# Patient Record
Sex: Female | Born: 1975 | Race: Black or African American | Hispanic: No | Marital: Married | Smoking: Never smoker
Health system: Southern US, Community
[De-identification: ages and names within clinical notes are randomized; demographics above are authoritative.]

## PROBLEM LIST (undated history)

## (undated) HISTORY — PX: NO PAST SURGERIES: SHX2092

---

## 1998-01-17 ENCOUNTER — Ambulatory Visit (HOSPITAL_COMMUNITY): Admission: AD | Admit: 1998-01-17 | Discharge: 1998-01-17 | Payer: Self-pay | Admitting: Obstetrics and Gynecology

## 1998-11-09 ENCOUNTER — Emergency Department (HOSPITAL_COMMUNITY): Admission: EM | Admit: 1998-11-09 | Discharge: 1998-11-09 | Payer: Self-pay | Admitting: Emergency Medicine

## 1999-05-22 ENCOUNTER — Other Ambulatory Visit: Admission: RE | Admit: 1999-05-22 | Discharge: 1999-05-22 | Payer: Self-pay | Admitting: Obstetrics and Gynecology

## 2000-02-04 ENCOUNTER — Ambulatory Visit (HOSPITAL_COMMUNITY): Admission: RE | Admit: 2000-02-04 | Discharge: 2000-02-04 | Payer: Self-pay | Admitting: *Deleted

## 2001-01-08 ENCOUNTER — Other Ambulatory Visit: Admission: RE | Admit: 2001-01-08 | Discharge: 2001-01-08 | Payer: Self-pay | Admitting: Obstetrics and Gynecology

## 2002-05-25 ENCOUNTER — Encounter: Payer: Self-pay | Admitting: *Deleted

## 2002-05-25 ENCOUNTER — Inpatient Hospital Stay (HOSPITAL_COMMUNITY): Admission: AD | Admit: 2002-05-25 | Discharge: 2002-05-25 | Payer: Self-pay | Admitting: *Deleted

## 2002-11-17 ENCOUNTER — Inpatient Hospital Stay (HOSPITAL_COMMUNITY): Admission: AD | Admit: 2002-11-17 | Discharge: 2002-11-20 | Payer: Self-pay | Admitting: Obstetrics & Gynecology

## 2002-12-03 ENCOUNTER — Encounter: Admission: RE | Admit: 2002-12-03 | Discharge: 2003-01-02 | Payer: Self-pay | Admitting: Obstetrics & Gynecology

## 2004-01-03 ENCOUNTER — Emergency Department (HOSPITAL_COMMUNITY): Admission: EM | Admit: 2004-01-03 | Discharge: 2004-01-03 | Payer: Self-pay | Admitting: Emergency Medicine

## 2004-12-21 ENCOUNTER — Ambulatory Visit (HOSPITAL_COMMUNITY): Admission: RE | Admit: 2004-12-21 | Discharge: 2004-12-21 | Payer: Self-pay | Admitting: Obstetrics & Gynecology

## 2004-12-24 ENCOUNTER — Encounter: Admission: RE | Admit: 2004-12-24 | Discharge: 2004-12-24 | Payer: Self-pay | Admitting: Obstetrics & Gynecology

## 2006-12-03 ENCOUNTER — Emergency Department: Admission: EM | Admit: 2006-12-03 | Discharge: 2006-12-03 | Payer: Self-pay | Admitting: Emergency Medicine

## 2006-12-23 ENCOUNTER — Ambulatory Visit: Payer: Self-pay | Admitting: Family Medicine

## 2007-01-13 ENCOUNTER — Ambulatory Visit: Payer: Self-pay | Admitting: Family Medicine

## 2007-10-13 ENCOUNTER — Ambulatory Visit: Payer: Self-pay | Admitting: Family Medicine

## 2008-08-18 ENCOUNTER — Inpatient Hospital Stay (HOSPITAL_COMMUNITY): Admission: AD | Admit: 2008-08-18 | Discharge: 2008-08-18 | Payer: Self-pay | Admitting: Obstetrics & Gynecology

## 2008-10-04 ENCOUNTER — Inpatient Hospital Stay (HOSPITAL_COMMUNITY): Admission: AD | Admit: 2008-10-04 | Discharge: 2008-10-04 | Payer: Self-pay | Admitting: Obstetrics and Gynecology

## 2009-03-08 ENCOUNTER — Encounter: Payer: Self-pay | Admitting: Advanced Practice Midwife

## 2009-03-08 ENCOUNTER — Ambulatory Visit: Payer: Self-pay | Admitting: Obstetrics and Gynecology

## 2009-03-08 LAB — CONVERTED CEMR LAB
HCT: 31 % — ABNORMAL LOW (ref 36.0–46.0)
Hemoglobin: 10.1 g/dL — ABNORMAL LOW (ref 12.0–15.0)
MCHC: 32.6 g/dL (ref 30.0–36.0)
MCV: 86.4 fL (ref 78.0–100.0)
Platelets: 315 10*3/uL (ref 150–400)
RBC: 3.59 M/uL — ABNORMAL LOW (ref 3.87–5.11)
RDW: 13.6 % (ref 11.5–15.5)
WBC: 9.2 10*3/uL (ref 4.0–10.5)

## 2009-03-22 ENCOUNTER — Ambulatory Visit: Payer: Self-pay | Admitting: Obstetrics and Gynecology

## 2009-03-22 LAB — CONVERTED CEMR LAB
Chlamydia, DNA Probe: NEGATIVE
GC Probe Amp, Genital: NEGATIVE

## 2009-03-29 ENCOUNTER — Ambulatory Visit: Payer: Self-pay | Admitting: Obstetrics and Gynecology

## 2009-04-05 ENCOUNTER — Ambulatory Visit: Payer: Self-pay | Admitting: Obstetrics and Gynecology

## 2009-04-07 ENCOUNTER — Ambulatory Visit: Payer: Self-pay | Admitting: Family Medicine

## 2009-04-07 ENCOUNTER — Inpatient Hospital Stay (HOSPITAL_COMMUNITY): Admission: AD | Admit: 2009-04-07 | Discharge: 2009-04-07 | Payer: Self-pay | Admitting: Obstetrics & Gynecology

## 2009-04-12 ENCOUNTER — Ambulatory Visit: Payer: Self-pay | Admitting: Obstetrics and Gynecology

## 2009-04-12 LAB — CONVERTED CEMR LAB
HCT: 35 % — ABNORMAL LOW (ref 36.0–46.0)
Hemoglobin: 11.3 g/dL — ABNORMAL LOW (ref 12.0–15.0)
MCHC: 32.3 g/dL (ref 30.0–36.0)
MCV: 89.1 fL (ref 78.0–100.0)
Platelets: 304 10*3/uL (ref 150–400)
RBC: 3.93 M/uL (ref 3.87–5.11)
RDW: 15.4 % (ref 11.5–15.5)
WBC: 9.7 10*3/uL (ref 4.0–10.5)

## 2009-04-13 ENCOUNTER — Inpatient Hospital Stay (HOSPITAL_COMMUNITY): Admission: AD | Admit: 2009-04-13 | Discharge: 2009-04-13 | Payer: Self-pay | Admitting: Obstetrics & Gynecology

## 2009-04-14 ENCOUNTER — Inpatient Hospital Stay (HOSPITAL_COMMUNITY): Admission: AD | Admit: 2009-04-14 | Discharge: 2009-04-16 | Payer: Self-pay | Admitting: Obstetrics and Gynecology

## 2009-04-14 ENCOUNTER — Ambulatory Visit: Payer: Self-pay | Admitting: Family Medicine

## 2009-07-14 ENCOUNTER — Ambulatory Visit: Payer: Self-pay | Admitting: Obstetrics & Gynecology

## 2010-04-08 LAB — POCT PREGNANCY, URINE: Preg Test, Ur: NEGATIVE

## 2010-04-11 LAB — POCT URINALYSIS DIP (DEVICE)
Bilirubin Urine: NEGATIVE
Glucose, UA: NEGATIVE mg/dL
Hgb urine dipstick: NEGATIVE
Ketones, ur: NEGATIVE mg/dL
Nitrite: NEGATIVE
Protein, ur: NEGATIVE mg/dL
Specific Gravity, Urine: 1.02 (ref 1.005–1.030)
Urobilinogen, UA: 0.2 mg/dL (ref 0.0–1.0)
pH: 7 (ref 5.0–8.0)

## 2010-04-15 LAB — POCT URINALYSIS DIP (DEVICE)
Bilirubin Urine: NEGATIVE
Bilirubin Urine: NEGATIVE
Bilirubin Urine: NEGATIVE
Bilirubin Urine: NEGATIVE
Glucose, UA: NEGATIVE mg/dL
Glucose, UA: NEGATIVE mg/dL
Glucose, UA: NEGATIVE mg/dL
Glucose, UA: NEGATIVE mg/dL
Hgb urine dipstick: NEGATIVE
Hgb urine dipstick: NEGATIVE
Hgb urine dipstick: NEGATIVE
Ketones, ur: NEGATIVE mg/dL
Ketones, ur: NEGATIVE mg/dL
Ketones, ur: NEGATIVE mg/dL
Ketones, ur: NEGATIVE mg/dL
Nitrite: NEGATIVE
Nitrite: NEGATIVE
Nitrite: NEGATIVE
Nitrite: NEGATIVE
Protein, ur: NEGATIVE mg/dL
Protein, ur: NEGATIVE mg/dL
Protein, ur: NEGATIVE mg/dL
Protein, ur: NEGATIVE mg/dL
Specific Gravity, Urine: 1.015 (ref 1.005–1.030)
Specific Gravity, Urine: 1.02 (ref 1.005–1.030)
Specific Gravity, Urine: 1.02 (ref 1.005–1.030)
Specific Gravity, Urine: 1.02 (ref 1.005–1.030)
Urobilinogen, UA: 0.2 mg/dL (ref 0.0–1.0)
Urobilinogen, UA: 0.2 mg/dL (ref 0.0–1.0)
Urobilinogen, UA: 0.2 mg/dL (ref 0.0–1.0)
Urobilinogen, UA: 0.2 mg/dL (ref 0.0–1.0)
pH: 6 (ref 5.0–8.0)
pH: 6.5 (ref 5.0–8.0)
pH: 6.5 (ref 5.0–8.0)
pH: 6.5 (ref 5.0–8.0)

## 2010-04-15 LAB — CBC
HCT: 30.9 % — ABNORMAL LOW (ref 36.0–46.0)
HCT: 34.6 % — ABNORMAL LOW (ref 36.0–46.0)
Hemoglobin: 10.5 g/dL — ABNORMAL LOW (ref 12.0–15.0)
Hemoglobin: 11.5 g/dL — ABNORMAL LOW (ref 12.0–15.0)
MCHC: 33.3 g/dL (ref 30.0–36.0)
MCHC: 34.1 g/dL (ref 30.0–36.0)
MCV: 91 fL (ref 78.0–100.0)
MCV: 91.2 fL (ref 78.0–100.0)
Platelets: 234 10*3/uL (ref 150–400)
Platelets: 259 10*3/uL (ref 150–400)
RBC: 3.39 MIL/uL — ABNORMAL LOW (ref 3.87–5.11)
RBC: 3.8 MIL/uL — ABNORMAL LOW (ref 3.87–5.11)
RDW: 15.7 % — ABNORMAL HIGH (ref 11.5–15.5)
RDW: 15.8 % — ABNORMAL HIGH (ref 11.5–15.5)
WBC: 13.5 10*3/uL — ABNORMAL HIGH (ref 4.0–10.5)
WBC: 9.4 10*3/uL (ref 4.0–10.5)

## 2010-04-15 LAB — RPR: RPR Ser Ql: NONREACTIVE

## 2010-04-27 LAB — URINALYSIS, ROUTINE W REFLEX MICROSCOPIC
Bilirubin Urine: NEGATIVE
Glucose, UA: NEGATIVE mg/dL
Hgb urine dipstick: NEGATIVE
Ketones, ur: NEGATIVE mg/dL
Nitrite: NEGATIVE
Protein, ur: NEGATIVE mg/dL
Specific Gravity, Urine: 1.02 (ref 1.005–1.030)
Urobilinogen, UA: 4 mg/dL — ABNORMAL HIGH (ref 0.0–1.0)
pH: 7 (ref 5.0–8.0)

## 2010-04-27 LAB — URINE MICROSCOPIC-ADD ON

## 2010-04-29 LAB — URINALYSIS, ROUTINE W REFLEX MICROSCOPIC
Bilirubin Urine: NEGATIVE
Glucose, UA: NEGATIVE mg/dL
Hgb urine dipstick: NEGATIVE
Ketones, ur: NEGATIVE mg/dL
Nitrite: NEGATIVE
Protein, ur: NEGATIVE mg/dL
Specific Gravity, Urine: 1.02 (ref 1.005–1.030)
Urobilinogen, UA: 1 mg/dL (ref 0.0–1.0)
pH: 7 (ref 5.0–8.0)

## 2010-04-29 LAB — CBC
HCT: 34.5 % — ABNORMAL LOW (ref 36.0–46.0)
Hemoglobin: 11.8 g/dL — ABNORMAL LOW (ref 12.0–15.0)
MCHC: 34.1 g/dL (ref 30.0–36.0)
MCV: 92.9 fL (ref 78.0–100.0)
Platelets: 281 10*3/uL (ref 150–400)
RBC: 3.72 MIL/uL — ABNORMAL LOW (ref 3.87–5.11)
RDW: 12.5 % (ref 11.5–15.5)
WBC: 7.8 10*3/uL (ref 4.0–10.5)

## 2010-04-29 LAB — WET PREP, GENITAL
Trich, Wet Prep: NONE SEEN
Yeast Wet Prep HPF POC: NONE SEEN

## 2010-04-29 LAB — GC/CHLAMYDIA PROBE AMP, GENITAL
Chlamydia, DNA Probe: NEGATIVE
GC Probe Amp, Genital: NEGATIVE

## 2010-04-29 LAB — POCT PREGNANCY, URINE: Preg Test, Ur: POSITIVE

## 2010-04-29 IMAGING — US US OB COMP LESS 14 WK
1 series · 14 of 25 positions shown · non-contrast
Comparison: none

OBSTETRICAL ULTRASOUND:
 This ultrasound exam was performed in the [HOSPITAL] Ultrasound Department.  The OB US report was generated in the AS system, and faxed to the ordering physician.  This report is also available in [REDACTED] PACS.

[Series 1: us ob comp less 14 wks · 0.19mm/px · 14 of 25 slices shown]
[im 1/25]
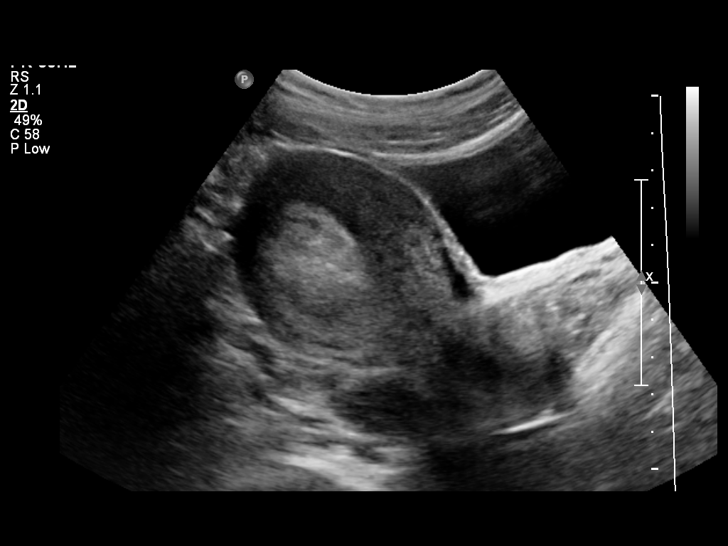
[im 3/25]
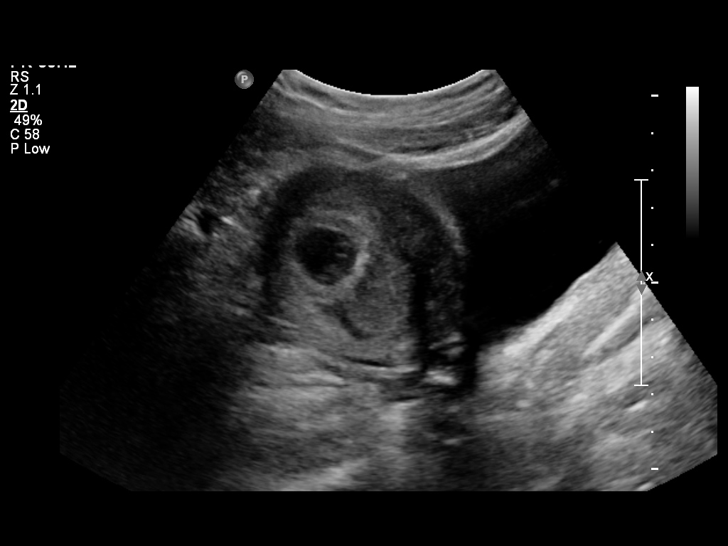
[im 5/25]
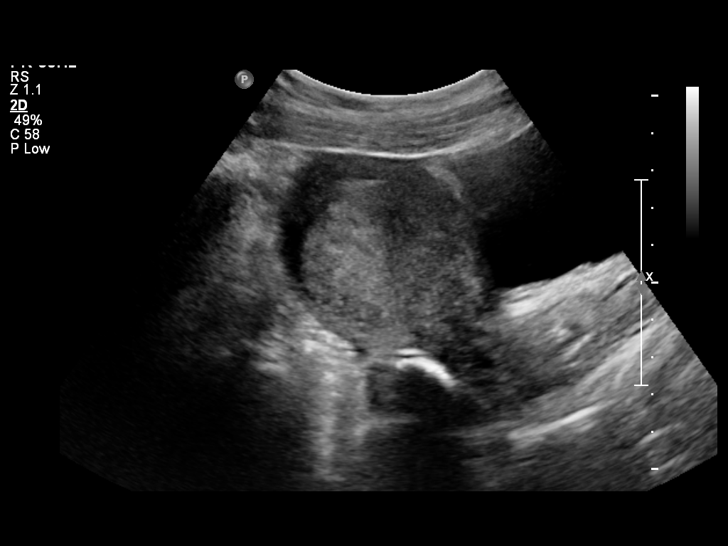
[im 7/25]
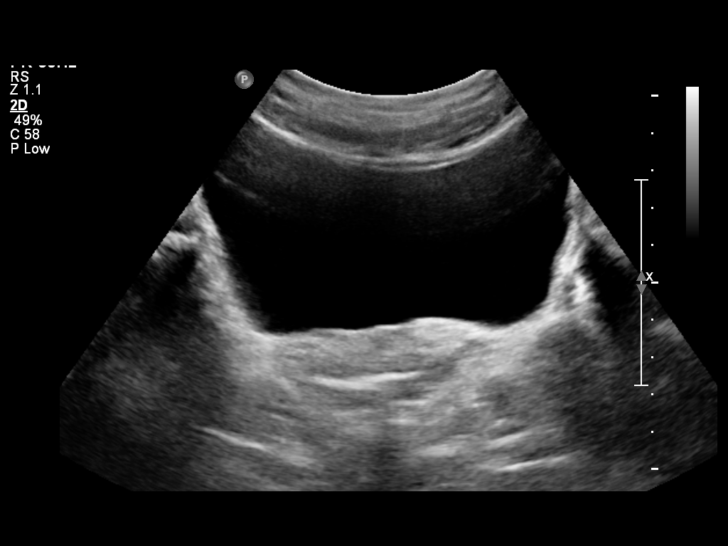
[im 9/25]
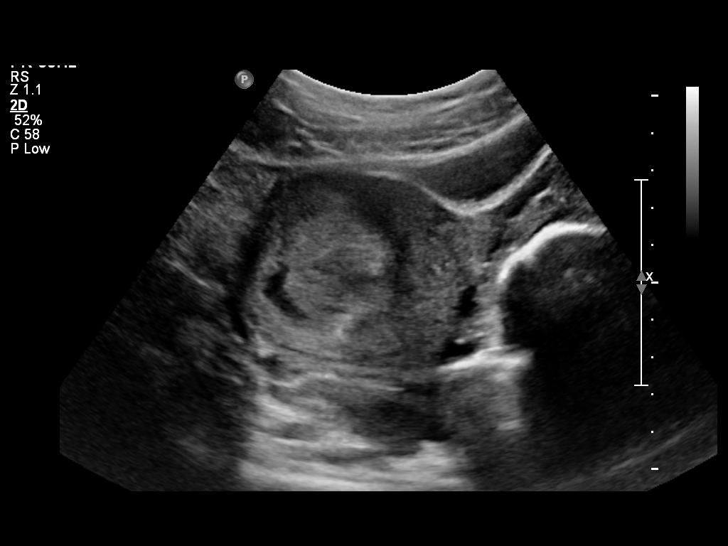
[im 10/25]
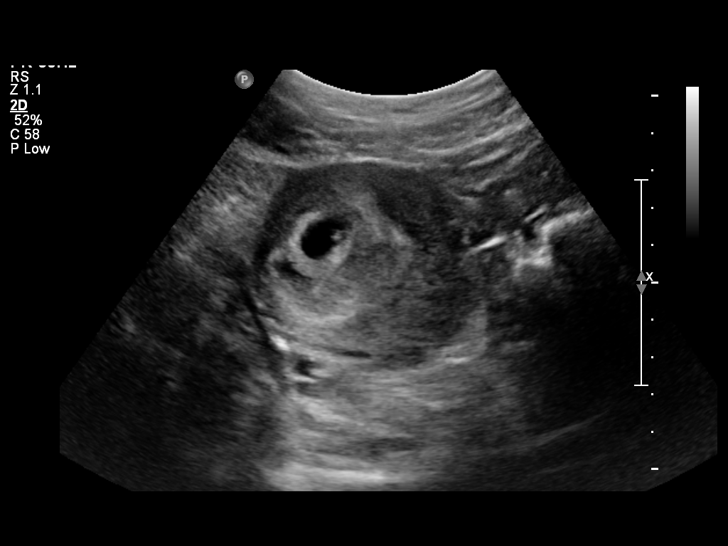
[im 12/25]
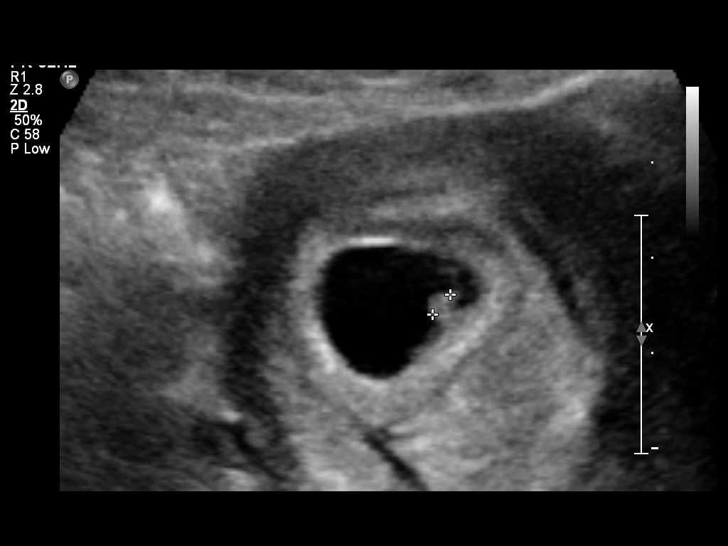
[im 14/25]
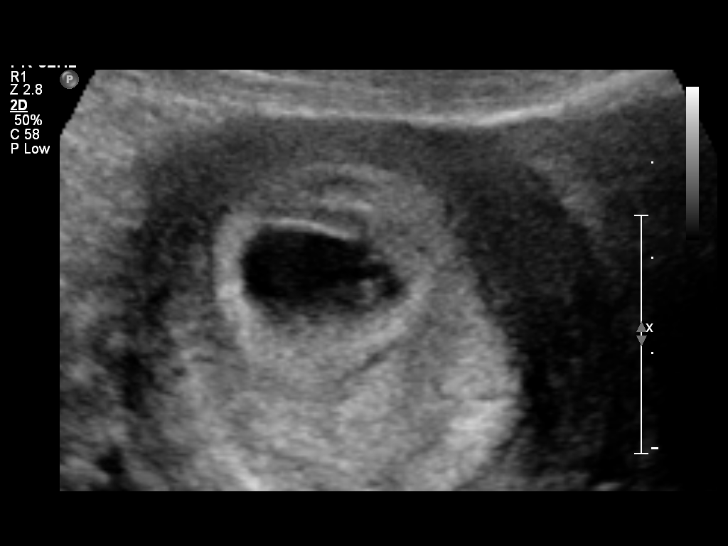
[im 16/25]
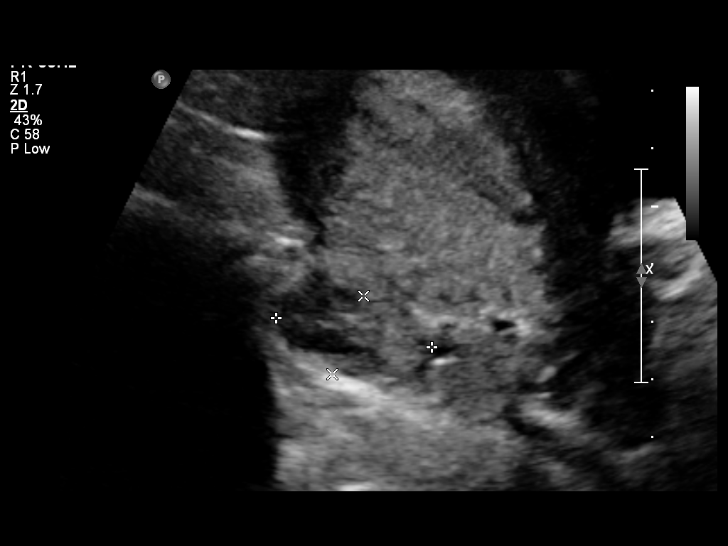
[im 17/25]
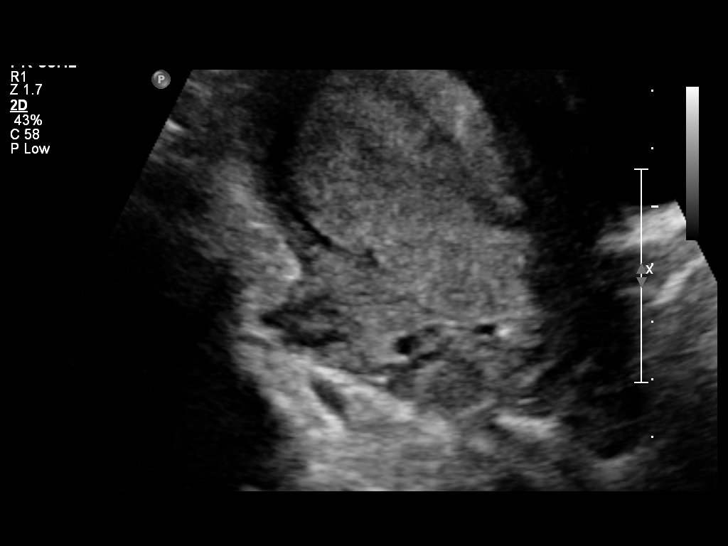
[im 19/25]
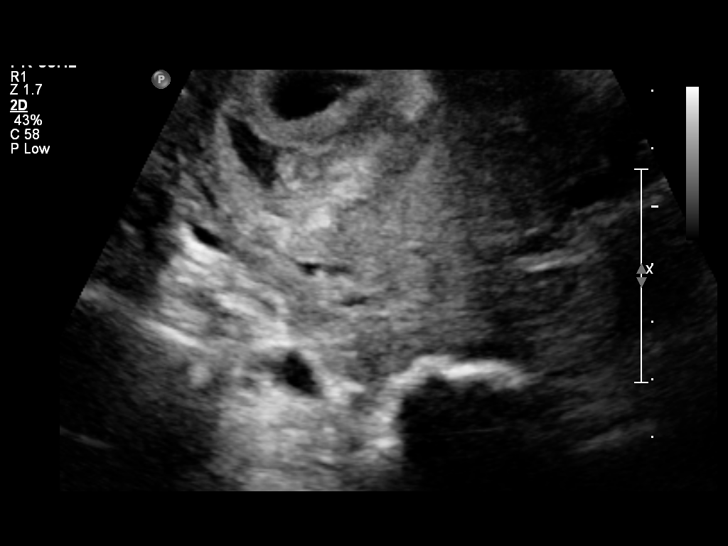
[im 21/25]
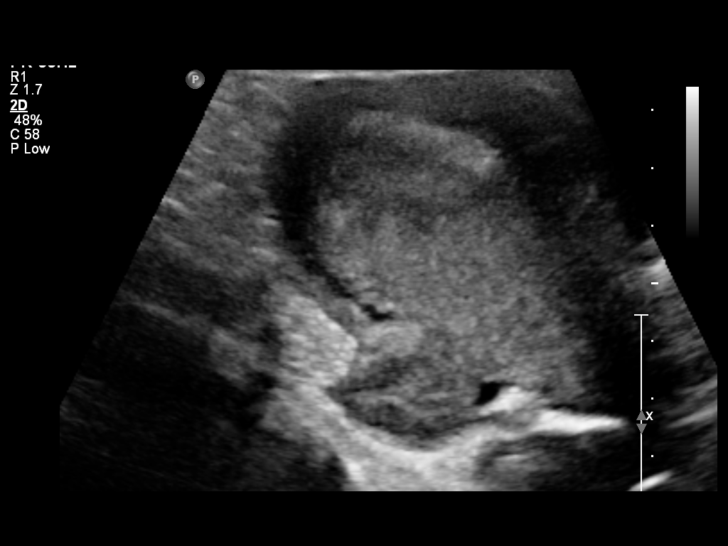
[im 23/25]
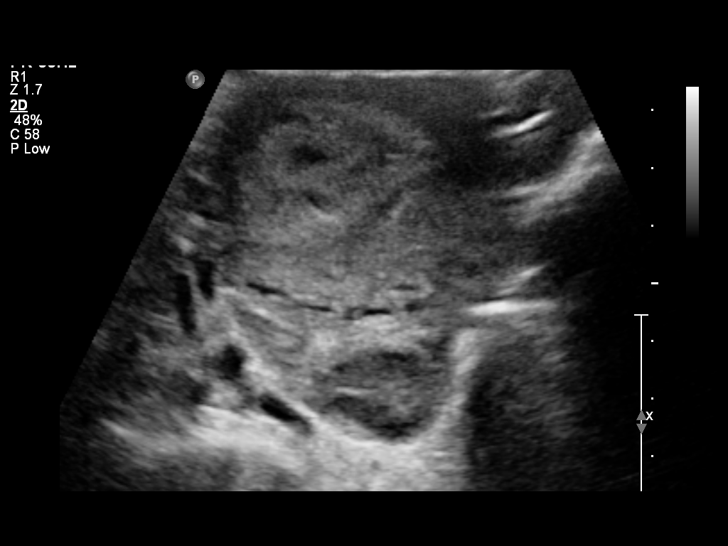
[im 25/25]
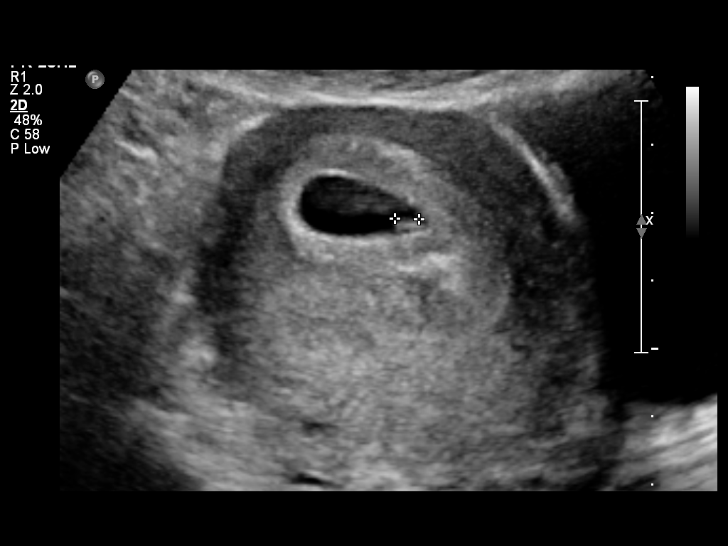

[14 of 25 positions shown; findings below may reference images not displayed]

IMPRESSION: See AS Obstetric US report.

## 2010-10-30 LAB — BASIC METABOLIC PANEL
BUN: 11
CO2: 27
Calcium: 9.2
Chloride: 107
Creatinine, Ser: 0.7
GFR calc Af Amer: >60
GFR calc non Af Amer: >60
Glucose, Bld: 112 — ABNORMAL HIGH
Potassium: 3.9
Sodium: 140

## 2010-10-30 LAB — DIFFERENTIAL
Basophils Absolute: 0.1
Eosinophils Absolute: 0.1 — ABNORMAL LOW
Eosinophils Relative: 1

## 2010-10-30 LAB — CBC
HCT: 33.7 — ABNORMAL LOW
Hemoglobin: 11.6 — ABNORMAL LOW
MCHC: 34.3
MCV: 89.6
Platelets: 303
RBC: 3.76 — ABNORMAL LOW
RDW: 12.7
WBC: 7.6

## 2010-10-30 LAB — URINALYSIS, ROUTINE W REFLEX MICROSCOPIC
Bilirubin Urine: NEGATIVE
Ketones, ur: NEGATIVE
Nitrite: NEGATIVE
Urobilinogen, UA: 0.2

## 2010-10-30 LAB — PREGNANCY, URINE: Preg Test, Ur: NEGATIVE

## 2011-05-31 ENCOUNTER — Encounter (HOSPITAL_BASED_OUTPATIENT_CLINIC_OR_DEPARTMENT_OTHER): Payer: Self-pay | Admitting: *Deleted

## 2011-05-31 ENCOUNTER — Emergency Department (HOSPITAL_BASED_OUTPATIENT_CLINIC_OR_DEPARTMENT_OTHER)
Admission: EM | Admit: 2011-05-31 | Discharge: 2011-05-31 | Disposition: A | Discharge status: Home / Self Care | Payer: PRIVATE HEALTH INSURANCE | Attending: Emergency Medicine | Admitting: Emergency Medicine

## 2011-05-31 DIAGNOSIS — J029 Acute pharyngitis, unspecified: Secondary | ICD-10-CM | POA: Insufficient documentation

## 2011-05-31 MED ORDER — AMOXICILLIN 500 MG PO CAPS: 500.0000 mg | ORAL_CAPSULE | Freq: Three times a day (TID) | ORAL | Status: AC

## 2011-05-31 NOTE — ED Notes (Signed)
C/o sore throat and bilateral ear pain for few days

## 2011-05-31 NOTE — Discharge Instructions (Signed)

## 2011-05-31 NOTE — ED Provider Notes (Signed)
History     CSN: [id number]  Arrival date & time 05/31/11  0006   First MD Initiated Contact with Patient 05/31/11 0017      Chief Complaint  Patient presents with  . Sore Throat    (Consider location/radiation/quality/duration/timing/severity/associated sxs/prior treatment) Patient is a 36 y.o. female presenting with pharyngitis. The history is provided by the patient. No language interpreter was used.  Sore Throat This is a new problem. The current episode started more than 2 days ago. The problem occurs constantly. The problem has not changed since onset.Pertinent negatives include no chest pain, no abdominal pain, no headaches and no shortness of breath. The symptoms are aggravated by nothing. The symptoms are relieved by nothing. She has tried nothing for the symptoms. The treatment provided no relief.  No change in voice no difficulty swallowing  History reviewed. No pertinent past medical history.  History reviewed. No pertinent past surgical history.  History reviewed. No pertinent family history.  History  Substance Use Topics  . Smoking status: Never Smoker   . Smokeless tobacco: Not on file  . Alcohol Use: Yes    OB History    Grav Para Term Preterm Abortions TAB SAB Ect Mult Living                  Review of Systems  Constitutional: Negative for fever.  HENT: Negative for neck pain and neck stiffness.   Respiratory: Negative for shortness of breath.   Cardiovascular: Negative for chest pain.  Gastrointestinal: Negative for abdominal pain.  Neurological: Negative for headaches.  All other systems reviewed and are negative.    Allergies  Review of patient's allergies indicates no known allergies.  Home Medications  No current outpatient prescriptions on file.  BP 110/71  Pulse 80  Temp(Src) 98.8 F (37.1 C) (Oral)  Resp 16  Ht 5\' 8"  (1.727 m)  Wt 170 lb (77.111 kg)  BMI 25.85 kg/m2  SpO2 99%  Physical Exam  Constitutional: She is oriented  to person, place, and time. She appears well-developed and well-nourished.  HENT:  Head: Normocephalic and atraumatic.  Right Ear: No mastoid tenderness. Tympanic membrane is not injected.  Left Ear: No mastoid tenderness. Tympanic membrane is not injected.  Mouth/Throat: Oropharynx is clear and moist.  Eyes: Conjunctivae are normal. Pupils are equal, round, and reactive to light.  Neck: Normal range of motion. Neck supple.  Cardiovascular: Normal rate and regular rhythm.   Pulmonary/Chest: Effort normal and breath sounds normal.  Abdominal: Soft. Bowel sounds are normal. There is no tenderness.  Musculoskeletal: Normal range of motion.  Lymphadenopathy:    She has no cervical adenopathy.  Neurological: She is alert and oriented to person, place, and time.  Skin: Skin is warm and dry.  Psychiatric: She has a normal mood and affect.    ED Course  Procedures (including critical care time)   Labs Reviewed  RAPID STREP SCREEN   No results found.   No diagnosis found.    MDM  RETURN for fevers chills, difficulty swallowing change in voice or any concerns.  Patient verbalizes understanding         Harrel Ferrone K Gavan Nordby-Rasch, MD 05/31/11 [phone]

## 2013-01-25 ENCOUNTER — Emergency Department (HOSPITAL_BASED_OUTPATIENT_CLINIC_OR_DEPARTMENT_OTHER)
Admission: EM | Admit: 2013-01-25 | Discharge: 2013-01-25 | Disposition: A | Discharge status: Home / Self Care | Payer: BC Managed Care – PPO | Attending: Emergency Medicine | Admitting: Emergency Medicine

## 2013-01-25 ENCOUNTER — Encounter (HOSPITAL_BASED_OUTPATIENT_CLINIC_OR_DEPARTMENT_OTHER): Payer: Self-pay | Admitting: Emergency Medicine

## 2013-01-25 DIAGNOSIS — K029 Dental caries, unspecified: Secondary | ICD-10-CM | POA: Insufficient documentation

## 2013-01-25 MED ORDER — IBUPROFEN 800 MG PO TABS
800.0000 mg | ORAL_TABLET | Freq: Once | ORAL | Status: AC
  Administered 2013-01-25: 800 mg via ORAL
  Filled 2013-01-25: qty 1

## 2013-01-25 MED ORDER — PENICILLIN V POTASSIUM 500 MG PO TABS: 500.0000 mg | ORAL_TABLET | Freq: Four times a day (QID) | ORAL | Status: DC

## 2013-01-25 MED ORDER — PENICILLIN V POTASSIUM 250 MG PO TABS
500.0000 mg | ORAL_TABLET | Freq: Once | ORAL | Status: AC
  Administered 2013-01-25: 500 mg via ORAL
  Filled 2013-01-25: qty 2

## 2013-01-25 MED ORDER — HYDROCODONE-ACETAMINOPHEN 5-325 MG PO TABS: 1.0000 | ORAL_TABLET | Freq: Four times a day (QID) | ORAL | Status: DC | PRN

## 2013-01-25 NOTE — ED Provider Notes (Signed)
CSN: [id number]     Arrival date & time 01/25/13  0506 History   First MD Initiated Contact with Patient 01/25/13 [phone]     Chief Complaint  Patient presents with  . Dental Pain   (Consider location/radiation/quality/duration/timing/severity/associated sxs/prior Treatment) Patient is a 38 y.o. female presenting with tooth pain. The history is provided by the patient.  Dental Pain Location:  Lower Lower teeth location:  19/LL 1st molar and 20/LL 2nd bicuspid Quality:  Dull Severity:  Moderate Onset quality:  Gradual Duration:  1 week Timing:  Intermittent Progression:  Unchanged Chronicity:  New Context: poor dentition   Previous work-up:  Dental exam and filled cavity Relieved by:  Nothing Worsened by:  Nothing tried Ineffective treatments:  None tried Associated symptoms: no congestion, no fever and no neck swelling   Risk factors: no alcohol problem     No past medical history on file. No past surgical history on file. No family history on file. History  Substance Use Topics  . Smoking status: Never Smoker   . Smokeless tobacco: Not on file  . Alcohol Use: Yes   OB History   Grav Para Term Preterm Abortions TAB SAB Ect Mult Living                 Review of Systems  Constitutional: Negative for fever.  HENT: Negative for congestion.   All other systems reviewed and are negative.    Allergies  Review of patient's allergies indicates no known allergies.  Home Medications  No current outpatient prescriptions on file. BP 105/71  Pulse 82  Temp(Src) 99 F (37.2 C) (Oral)  Resp 18  Ht 5\' 8"  (1.727 m)  Wt 170 lb (77.111 kg)  BMI 25.85 kg/m2  SpO2 98% Physical Exam  Constitutional: She is oriented to person, place, and time. She appears well-developed and well-nourished.  HENT:  Head: Normocephalic and atraumatic.  Mouth/Throat: No oropharyngeal exudate.  Multiple dental caries  Eyes: Conjunctivae are normal. Pupils are equal, round, and reactive to light.   Neck: Normal range of motion. Neck supple.  Cardiovascular: Normal rate, regular rhythm and intact distal pulses.   Pulmonary/Chest: Effort normal and breath sounds normal. She has no wheezes. She has no rales.  Abdominal: Soft. Bowel sounds are normal. There is no tenderness. There is no rebound and no guarding.  Musculoskeletal: Normal range of motion.  Neurological: She is alert and oriented to person, place, and time.  Skin: Skin is warm and dry.  Psychiatric: She has a normal mood and affect.    ED Course  Procedures (including critical care time) Labs Review Labs Reviewed - No data to display Imaging Review No results found.  EKG Interpretation   None       MDM  No diagnosis found. Penicillin and pain medication and follow up with a dentist    Christina Gintz K Nabor Thomann-Rasch, MD 01/25/13 [phone]

## 2013-01-25 NOTE — ED Provider Notes (Signed)
Use barrier protection for one full month when using antibiotics as antibiotics can decreased effectiveness of contraception.  Patient verbalizes understanding  Patricia Mkrtchyan K Athony Coppa-Rasch, MD 01/25/13 [phone]

## 2013-01-25 NOTE — ED Notes (Signed)
Pt c/o left lower tooth pain that started one week ago. Fevers on and off per pt. Tylenol and ibuprofen. Pt states she will call a dentist this morning.

## 2013-01-25 NOTE — Discharge Instructions (Signed)
Dental Care and Dentist Visits Dental care supports good overall health. Regular dental visits can also help you avoid dental pain, bleeding, infection, and other more serious health problems in the future. It is important to keep the mouth healthy because diseases in the teeth, gums, and other oral tissues can spread to other areas of the body. Some problems, such as diabetes, heart disease, and pre-term labor have been associated with poor oral health.  See your dentist every 6 months. If you experience emergency problems such as a toothache or broken tooth, go to the dentist right away. If you see your dentist regularly, you may catch problems early. It is easier to be treated for problems in the early stages.  WHAT TO EXPECT AT A DENTIST VISIT  Your dentist will look for many common oral health problems and recommend proper treatment. At your regular dental visit, you can expect:  Gentle cleaning of the teeth and gums. This includes scraping and polishing. This helps to remove the sticky substance around the teeth and gums (plaque). Plaque forms in the mouth shortly after eating. Over time, plaque hardens on the teeth as tartar. If tartar is not removed regularly, it can cause problems. Cleaning also helps remove stains.  Periodic X-rays. These pictures of the teeth and supporting bone will help your dentist assess the health of your teeth.  Periodic fluoride treatments. Fluoride is a natural mineral shown to help strengthen teeth. Fluoride treatmentinvolves applying a fluoride gel or varnish to the teeth. It is most commonly done in children.  Examination of the mouth, tongue, jaws, teeth, and gums to look for any oral health problems, such as:  Cavities (dental caries). This is decay on the tooth caused by plaque, sugar, and acid in the mouth. It is best to catch a cavity when it is small.  Inflammation of the gums caused by plaque buildup (gingivitis).  Problems with the mouth or malformed  or misaligned teeth.  Oral cancer or other diseases of the soft tissues or jaws. KEEP YOUR TEETH AND GUMS HEALTHY For healthy teeth and gums, follow these general guidelines as well as your dentist's specific advice:  Have your teeth professionally cleaned at the dentist every 6 months.  Brush twice daily with a fluoride toothpaste.  Floss your teeth daily.  Ask your dentist if you need fluoride supplements, treatments, or fluoride toothpaste.  Eat a healthy diet. Reduce foods and drinks with added sugar.  Avoid smoking. TREATMENT FOR ORAL HEALTH PROBLEMS If you have oral health problems, treatment varies depending on the conditions present in your teeth and gums.  Your caregiver will most likely recommend good oral hygiene at each visit.  For cavities, gingivitis, or other oral health disease, your caregiver will perform a procedure to treat the problem. This is typically done at a separate appointment. Sometimes your caregiver will refer you to another dental specialist for specific tooth problems or for surgery. SEEK IMMEDIATE DENTAL CARE IF:  You have pain, bleeding, or soreness in the gum, tooth, jaw, or mouth area.  A permanent tooth becomes loose or separated from the gum socket.  You experience a blow or injury to the mouth or jaw area. Document Released: 09/19/2010 Document Revised: 04/01/2011 Document Reviewed: 09/19/2010 [employer] Patient Information 2014 [employer].  

## 2013-11-19 ENCOUNTER — Ambulatory Visit: Payer: Self-pay | Admitting: Obstetrics & Gynecology

## 2013-12-08 ENCOUNTER — Ambulatory Visit: Payer: Self-pay | Admitting: Obstetrics & Gynecology

## 2013-12-13 ENCOUNTER — Ambulatory Visit (INDEPENDENT_AMBULATORY_CARE_PROVIDER_SITE_OTHER): Payer: BC Managed Care – PPO | Admitting: Obstetrics & Gynecology

## 2013-12-13 ENCOUNTER — Encounter: Payer: Self-pay | Admitting: Obstetrics & Gynecology

## 2013-12-13 VITALS — BP 106/72 | HR 89 | Ht 68.5 in | Wt 173.0 lb

## 2013-12-13 DIAGNOSIS — R234 Changes in skin texture: Secondary | ICD-10-CM

## 2013-12-13 DIAGNOSIS — Z30011 Encounter for initial prescription of contraceptive pills: Secondary | ICD-10-CM

## 2013-12-13 LAB — POCT URINE PREGNANCY: PREG TEST UR: NEGATIVE

## 2013-12-13 MED ORDER — NORETHIN-ETH ESTRAD-FE BIPHAS 1 MG-10 MCG / 10 MCG PO TABS: 1.0000 | ORAL_TABLET | Freq: Every day | ORAL | Status: DC

## 2013-12-14 NOTE — Progress Notes (Signed)
Patient ID: Patricia Hunter, female   DOB: [birth year], 38 y.o.   MRN: [id number]  Chief Complaint  Patient presents with  . Other    Problem- Knot on left breast.    HPI Patricia Hunter is a 38 y.o. female.  Superficial, left, upper inner quadrant, nodule < 1 cm x 10 yrs.  Pt has been "picking at" the lesion--tender, darker.  HPI  History reviewed. No pertinent past medical history.  History reviewed. No pertinent past surgical history.  History reviewed. No pertinent family history.  Social History History  Substance Use Topics  . Smoking status: Never Smoker   . Smokeless tobacco: Not on file  . Alcohol Use: Yes    No Known Allergies  Current Outpatient Prescriptions  Medication Sig Dispense Refill  . Norethindrone-Ethinyl Estradiol-Fe Biphas (LO LOESTRIN FE) 1 MG-10 MCG / 10 MCG tablet Take 1 tablet by mouth daily. 1 Package 11   No current facility-administered medications for this visit.    Review of Systems Review of Systems Constitutional: negative for fatigue and weight loss Respiratory: negative for cough and wheezing Cardiovascular: negative for chest pain, fatigue and palpitations Gastrointestinal: negative for abdominal pain and change in bowel habits Genitourinary:negative for abnormal vaginal discharge Integument/breast: negative for nipple discharge Musculoskeletal:negative for myalgias Neurological: negative for gait problems and tremors Behavioral/Psych: negative for abusive relationship, depression Endocrine: negative for temperature intolerance     Blood pressure 106/72, pulse 89, height 5' 8.5" (1.74 m), weight 78.472 kg (173 lb), last menstrual period 12/08/2013.  Physical Exam Physical Exam General:   alert  Skin:   <1 cm, superficial nodule, hyperpigmented, discrete borders, NT, breast--left, upper inner quadrant  Lungs:   clear to auscultation bilaterally  Heart:   regular rate and rhythm, S1, S2 normal, no murmur, click,  rub or gallop  Breasts:   normal without suspicious masses, skin or nipple changes or axillary nodes  Abdomen:  normal findings: no organomegaly, soft, non-tender and no hernia      Data Reviewed UPT  Assessment    Scar-left breast--inflammation from irritation    Plan    Orders Placed This Encounter  Procedures  . POCT urine pregnancy   Meds ordered this encounter  Medications  . Norethindrone-Ethinyl Estradiol-Fe Biphas (LO LOESTRIN FE) 1 MG-10 MCG / 10 MCG tablet    Sig: Take 1 tablet by mouth daily.    Dispense:  1 Package    Refill:  11    Follow up as needed.        JACKSON-MOORE,[name] A 12/14/2013, 10:06 PM

## 2013-12-14 NOTE — Patient Instructions (Signed)

## 2014-01-17 ENCOUNTER — Encounter: Payer: Self-pay | Admitting: *Deleted

## 2014-01-18 ENCOUNTER — Encounter: Payer: Self-pay | Admitting: Obstetrics & Gynecology

## 2014-02-18 ENCOUNTER — Emergency Department: Payer: Self-pay | Admitting: Emergency Medicine

## 2014-02-18 LAB — CBC WITH DIFFERENTIAL/PLATELET
BASOS ABS: 0.1 10*3/uL (ref 0.0–0.1)
Basophil %: 0.5 %
EOS ABS: 0.1 10*3/uL (ref 0.0–0.7)
Eosinophil %: 0.7 %
HCT: 37.2 % (ref 35.0–47.0)
HGB: 12.3 g/dL (ref 12.0–16.0)
LYMPHS ABS: 4.4 10*3/uL — AB (ref 1.0–3.6)
LYMPHS PCT: 42.5 %
MCH: 30 pg (ref 26.0–34.0)
MCHC: 33.1 g/dL (ref 32.0–36.0)
MCV: 91 fL (ref 80–100)
MONO ABS: 0.6 x10 3/mm (ref 0.2–0.9)
Monocyte %: 5.9 %
NEUTROS ABS: 5.2 10*3/uL (ref 1.4–6.5)
Neutrophil %: 50.4 %
Platelet: 298 10*3/uL (ref 150–440)
RBC: 4.1 10*6/uL (ref 3.80–5.20)
RDW: 12.5 % (ref 11.5–14.5)
WBC: 10.4 10*3/uL (ref 3.6–11.0)

## 2014-02-18 LAB — URINALYSIS, COMPLETE
BACTERIA: NONE SEEN
BILIRUBIN, UR: NEGATIVE
Blood: NEGATIVE
GLUCOSE, UR: NEGATIVE mg/dL (ref 0–75)
Ketone: NEGATIVE
Nitrite: NEGATIVE
Ph: 6 (ref 4.5–8.0)
Protein: NEGATIVE
Specific Gravity: 1.024 (ref 1.003–1.030)
WBC UR: 8 /HPF (ref 0–5)

## 2014-02-18 LAB — WET PREP, GENITAL

## 2014-02-19 LAB — GC/CHLAMYDIA PROBE AMP

## 2020-05-07 ENCOUNTER — Emergency Department (HOSPITAL_BASED_OUTPATIENT_CLINIC_OR_DEPARTMENT_OTHER)
Admission: EM | Admit: 2020-05-07 | Discharge: 2020-05-07 | Disposition: A | Discharge status: Home / Self Care | Payer: Self-pay | Attending: Emergency Medicine | Admitting: Emergency Medicine

## 2020-05-07 ENCOUNTER — Other Ambulatory Visit: Payer: Self-pay

## 2020-05-07 DIAGNOSIS — S6992XA Unspecified injury of left wrist, hand and finger(s), initial encounter: Secondary | ICD-10-CM | POA: Insufficient documentation

## 2020-05-07 DIAGNOSIS — W4904XA Ring or other jewelry causing external constriction, initial encounter: Secondary | ICD-10-CM | POA: Insufficient documentation

## 2020-05-07 NOTE — ED Notes (Signed)
Ring removed, with no injury to finger.

## 2020-05-07 NOTE — ED Triage Notes (Addendum)
Ring stuck on left index , this Am, reports numbness around the ring. Good capillary refill

## 2020-05-07 NOTE — ED Notes (Signed)
Pt left prior to receiving completed discharge paperwork.  States she discussed instructions with provider.

## 2020-05-09 NOTE — ED Provider Notes (Signed)
[facility] EMERGENCY DEPARTMENT Provider Note   CSN: [id number] Arrival date & time: 05/07/20  0913     History Chief Complaint  Patient presents with  . Finger Injury    Left index    Patricia Hunter is a 45 y.o. female.  HPI     45 year old female presents with concern for ring stuck on her left index finger.  Woke up this morning with swelling of her finger, with ring stuck on it and numbness directly around the ring.  Prior to my evaluation, the ring was removed, and she reports significant improvement of symptoms.  Denies any fevers or finger injury, or other significant swelling.  No past medical history on file.  There are no problems to display for this patient.   No past surgical history on file.   OB History   No obstetric history on file.     No family history on file.  Social History   Tobacco Use  . Smoking status: Never Smoker  Substance Use Topics  . Alcohol use: Yes  . Drug use: No    Home Medications Prior to Admission medications   Medication Sig Start Date End Date Taking? Authorizing Provider  Norethindrone-Ethinyl Estradiol-Fe Biphas (LO LOESTRIN FE) 1 MG-10 MCG / 10 MCG tablet Take 1 tablet by mouth daily. 12/13/13   Antionette Char, MD    Allergies    Patient has no known allergies.  Review of Systems   Review of Systems  Constitutional: Negative for fever.  Musculoskeletal: Positive for arthralgias and joint swelling.  Skin: Negative for rash.    Physical Exam Updated Vital Signs BP 108/70 (BP Location: Right Arm)   Pulse 71   Temp 98.2 F (36.8 C) (Oral)   Resp 16   Ht 5\' 8"  (1.727 m)   Wt 68 kg   LMP 04/21/2020   SpO2 97%   BMI 22.81 kg/m   Physical Exam Vitals and nursing note reviewed.  Constitutional:      General: She is not in acute distress.    Appearance: Normal appearance. She is not ill-appearing, toxic-appearing or diaphoretic.  HENT:     Head: Normocephalic.  Eyes:      Conjunctiva/sclera: Conjunctivae normal.  Cardiovascular:     Rate and Rhythm: Normal rate and regular rhythm.     Pulses: Normal pulses.  Pulmonary:     Effort: Pulmonary effort is normal. No respiratory distress.  Musculoskeletal:        General: No deformity or signs of injury.     Cervical back: No rigidity.     Comments: Normal capillary refill, normal ROM index finger, slight swelling, no erythema  Skin:    General: Skin is warm and dry.     Coloration: Skin is not jaundiced or pale.  Neurological:     General: No focal deficit present.     Mental Status: She is alert.     ED Results / Procedures / Treatments   Labs (all labs ordered are listed, but only abnormal results are displayed) Labs Reviewed - No data to display  EKG None  Radiology No results found.  Procedures Procedures   Medications Ordered in ED Medications - No data to display  ED Course  I have reviewed the triage vital signs and the nursing notes.  Pertinent labs & imaging results that were available during my care of the patient were reviewed by me and considered in my medical decision making (see chart for details).  MDM Rules/Calculators/A&P                          45 year old female presents with concern for ring stuck on her left index finger.  Prior to my evaluation, the ring was removed without any injury.  She has no signs of flexor tenosynovitis, has full range of motion, normal capillary refill and sensation to the finger. No hx of injury. Patient discharged in stable condition with understanding of reasons to return.   Final Clinical Impression(s) / ED Diagnoses Final diagnoses:  Ring or other jewelry causing external constriction, initial encounter    Rx / DC Orders ED Discharge Orders    None       Alvira Monday, MD 05/09/20 [phone]

## 2021-01-18 ENCOUNTER — Other Ambulatory Visit (HOSPITAL_COMMUNITY)
Admission: RE | Admit: 2021-01-18 | Discharge: 2021-01-18 | Disposition: A | Discharge status: Home / Self Care | Payer: BC Managed Care – PPO | Source: Ambulatory Visit | Attending: Obstetrics and Gynecology | Admitting: Obstetrics and Gynecology

## 2021-01-18 ENCOUNTER — Other Ambulatory Visit: Payer: Self-pay

## 2021-01-18 ENCOUNTER — Ambulatory Visit (INDEPENDENT_AMBULATORY_CARE_PROVIDER_SITE_OTHER): Payer: BC Managed Care – PPO | Admitting: Obstetrics and Gynecology

## 2021-01-18 ENCOUNTER — Encounter: Payer: Self-pay | Admitting: Obstetrics and Gynecology

## 2021-01-18 DIAGNOSIS — Z113 Encounter for screening for infections with a predominantly sexual mode of transmission: Secondary | ICD-10-CM | POA: Diagnosis not present

## 2021-01-18 DIAGNOSIS — Z01419 Encounter for gynecological examination (general) (routine) without abnormal findings: Secondary | ICD-10-CM

## 2021-01-18 NOTE — Progress Notes (Signed)
Pt is here today for yearly exam. LMP: 01/02/21. BCM: none. Last pap: approx. 2 years ago WNL. [facility] 40 [facility] Pt declines to have mammogram. Offered to order today. Pt states she would like to have some blood work done. Yearly labs, concerned about Vit D level.

## 2021-01-18 NOTE — Progress Notes (Signed)
GYNECOLOGY ANNUAL PREVENTATIVE CARE ENCOUNTER NOTE  History:     Patricia Hunter is a 45 y.o. [phone] female here for a routine annual gynecologic exam.  Current complaints: none.   Denies abnormal vaginal bleeding, discharge, pelvic pain, problems with intercourse or other gynecologic concerns.    Gynecologic History Patient's last menstrual period was 01/02/2021 (exact date). Contraception: none Last Pap: verbal 2 years ago. Results were: normal with negative HPV Last mammogram: at age 63 verbal. Results were: normal per patient  Obstetric History OB History  Gravida Para Term Preterm AB Living  4 2 2  0 2 2  SAB IAB Ectopic Multiple Live Births  2 0 0 0 2    # Outcome Date GA Lbr Len/2nd Weight Sex Delivery Anes PTL Lv  4 SAB           3 SAB           2 Term           1 Term             Obstetric Comments  2 vaginal deliveries    No past medical history on file.  Past Surgical History:  Procedure Laterality Date   NO PAST SURGERIES      No current outpatient medications on file prior to visit.   No current facility-administered medications on file prior to visit.    No Known Allergies  Social History:  reports that she has never smoked. She does not have any smokeless tobacco history on file. She reports current alcohol use. She reports that she does not use drugs.  Family History  Problem Relation Age of Onset   Healthy Father    Healthy Mother     The following portions of the patient's history were reviewed and updated as appropriate: allergies, current medications, past family history, past medical history, past social history, past surgical history and problem list.  Review of Systems Pertinent items noted in HPI and remainder of comprehensive ROS otherwise negative.  Physical Exam:  BP 101/68    Pulse 75    Ht 5' 8.5" (1.74 m)    Wt 154 lb (69.9 kg)    LMP 01/02/2021 (Exact Date)    BMI 23.08 kg/m  CONSTITUTIONAL: Well-developed,  well-nourished female in no acute distress.  HENT:  Normocephalic, atraumatic, External right and left ear normal. Oropharynx is clear and moist EYES: Conjunctivae and EOM are normal.  NECK: Normal range of motion, supple, no masses.  Normal thyroid.  SKIN: Skin is warm and dry. No rash noted. Not diaphoretic. No erythema. No pallor. MUSCULOSKELETAL: Normal range of motion. No tenderness.  No cyanosis, clubbing, or edema.  2+ distal pulses. NEUROLOGIC: Alert and oriented to person, place, and time. Normal reflexes, muscle tone coordination.  PSYCHIATRIC: Normal mood and affect. Normal behavior. Normal judgment and thought content. CARDIOVASCULAR: Normal heart rate noted, regular rhythm RESPIRATORY: Clear to auscultation bilaterally. Effort and breath sounds normal, no problems with respiration noted. BREASTS: Symmetric in size. No masses, tenderness, skin changes, nipple drainage, or lymphadenopathy bilaterally. Performed in the presence of a chaperone. ABDOMEN: Soft, no distention noted.  No tenderness, rebound or guarding.  PELVIC: Normal appearing external genitalia and urethral meatus; normal appearing vaginal mucosa and cervix.  No abnormal discharge noted.  Pap smear obtained.  Normal uterine size, no other palpable masses, no uterine or adnexal tenderness.  Significant amount of clear gray fluid, probable from recent intercourse  Performed in the presence of a  chaperone.   Assessment and Plan:    1. Women's annual routine gynecological examination Normal exam Pt refused mammogram, emphasized yearly checks are very important Vit D ordered per pt request  - Cytology - PAP( [place]) - Vitamin D 1,25 dihydroxy  2. Screen for STD (sexually transmitted disease) Per pt request  - Cervicovaginal ancillary only( [place]) - HepB+HepC+HIV Panel - [facility]  Will follow up results of pap smear and manage accordingly. Routine preventative health maintenance measures emphasized. Please  refer to After Visit Summary for other counseling recommendations.      Mariel Aloe, MD, FACOG Obstetrician & Gynecologist, [facility] for [facility], [facility]

## 2021-01-19 LAB — CERVICOVAGINAL ANCILLARY ONLY
Chlamydia: NEGATIVE
Comment: NEGATIVE
Comment: NEGATIVE
Comment: NORMAL
Neisseria Gonorrhea: NEGATIVE
Trichomonas: NEGATIVE

## 2021-01-24 LAB — CYTOLOGY - PAP
Adequacy: ABSENT
Comment: NEGATIVE
Diagnosis: NEGATIVE
High risk HPV: NEGATIVE

## 2021-01-31 LAB — HEPB+HEPC+HIV PANEL
HIV Screen 4th Generation wRfx: NONREACTIVE
Hep B C IgM: NEGATIVE
Hep B Core Total Ab: NEGATIVE
Hep B E Ab: NEGATIVE
Hep B E Ag: NEGATIVE
Hep B Surface Ab, Qual: NONREACTIVE
Hep C Virus Ab: <0.1 s/co ratio (ref 0.0–0.9)
Hepatitis B Surface Ag: NEGATIVE

## 2021-01-31 LAB — VITAMIN D 1,25 DIHYDROXY
Vitamin D 1, 25 (OH)2 Total: 44 pg/mL
Vitamin D2 1, 25 (OH)2: <10 pg/mL
Vitamin D3 1, 25 (OH)2: 43 pg/mL

## 2021-01-31 LAB — RPR: RPR Ser Ql: NONREACTIVE

## 2021-03-05 DIAGNOSIS — M5430 Sciatica, unspecified side: Secondary | ICD-10-CM | POA: Diagnosis not present

## 2021-03-24 ENCOUNTER — Emergency Department (HOSPITAL_BASED_OUTPATIENT_CLINIC_OR_DEPARTMENT_OTHER)
Admission: EM | Admit: 2021-03-24 | Discharge: 2021-03-24 | Disposition: A | Discharge status: Home / Self Care | Payer: BC Managed Care – PPO | Attending: Emergency Medicine | Admitting: Emergency Medicine

## 2021-03-24 ENCOUNTER — Encounter (HOSPITAL_BASED_OUTPATIENT_CLINIC_OR_DEPARTMENT_OTHER): Payer: Self-pay | Admitting: Emergency Medicine

## 2021-03-24 DIAGNOSIS — W57XXXA Bitten or stung by nonvenomous insect and other nonvenomous arthropods, initial encounter: Secondary | ICD-10-CM

## 2021-03-24 DIAGNOSIS — S0096XA Insect bite (nonvenomous) of unspecified part of head, initial encounter: Secondary | ICD-10-CM | POA: Diagnosis not present

## 2021-03-24 DIAGNOSIS — S0086XA Insect bite (nonvenomous) of other part of head, initial encounter: Secondary | ICD-10-CM | POA: Insufficient documentation

## 2021-03-24 MED ORDER — PREDNISONE 20 MG PO TABS: ORAL_TABLET | ORAL | 0 refills | Status: AC

## 2021-03-24 MED ORDER — PREDNISONE 50 MG PO TABS
60.0000 mg | ORAL_TABLET | Freq: Once | ORAL | Status: AC
  Administered 2021-03-24: 60 mg via ORAL
  Filled 2021-03-24: qty 1

## 2021-03-24 NOTE — ED Provider Notes (Signed)
?[facility] EMERGENCY DEPARTMENT ?Provider Note ? ? ?CSN: [id number] ?Arrival date & time: 03/24/21  0128 ? ?  ? ?History ? ?Chief Complaint  ?Patient presents with  ? Insect Bite  ? ? ?Hilda A Walker-McKiver is a 46 y.o. female. ? ?The history is provided by the patient.  ?Animal Bite ?Contact animal:  Insect ?Location:  Shoulder/arm and face ?Facial injury location:  Forehead ?Shoulder/arm injury location:  L forearm ?Time since incident:  5 hours ?Pain details:  ?  Quality:  Itching ?  Severity:  No pain ?  Progression:  Unchanged ?Incident location:  Home (house is being bombed for some sort of insect infestation) ?Provoked: unprovoked   ?Notifications:  None ?Animal in possession: no   ?Tetanus status:  Up to date ?Relieved by:  Nothing ?Worsened by:  Nothing ?Ineffective treatments:  None tried ?Associated symptoms: no fever and no numbness   ? ?  ? ?Home Medications ?Prior to Admission medications   ?Not on File  ?   ? ?Allergies    ?Patient has no known allergies.   ? ?Review of Systems   ?Review of Systems  ?Constitutional:  Negative for fever.  ?HENT:  Negative for congestion.   ?Eyes:  Negative for photophobia and redness.  ?Respiratory:  Negative for chest tightness.   ?Cardiovascular:  Negative for chest pain.  ?Gastrointestinal:  Negative for abdominal pain.  ?Genitourinary:  Negative for dyspareunia.  ?Musculoskeletal:  Negative for neck pain and neck stiffness.  ?Skin:   ?     2 skin bumps at the sites of insect bites   ?Neurological:  Negative for numbness.  ?Psychiatric/Behavioral:  Negative for agitation.   ?All other systems reviewed and are negative. ? ?Physical Exam ?Updated Vital Signs ?BP 113/75 (BP Location: Left Arm)   Pulse 85   Temp 98.2 ?F (36.8 ?C) (Oral)   Resp 15   Ht 5\' 8"  (1.727 m)   Wt 67.6 kg   SpO2 99%   BMI 22.66 kg/m?  ?Physical Exam ?Vitals and nursing note reviewed.  ?Constitutional:   ?   Appearance: Normal appearance.  ?HENT:  ?   Head: Normocephalic.  ? ?    Nose: Nose normal.  ?   Mouth/Throat:  ?   Mouth: Mucous membranes are moist.  ?   Pharynx: Oropharynx is clear. No oropharyngeal exudate.  ?   Comments: No swelling of the lips or tongue or floor of the mouth ?Eyes:  ?   Extraocular Movements: Extraocular movements intact.  ?   Pupils: Pupils are equal, round, and reactive to light.  ?Cardiovascular:  ?   Rate and Rhythm: Normal rate and regular rhythm.  ?   Pulses: Normal pulses.  ?   Heart sounds: Normal heart sounds.  ?Pulmonary:  ?   Effort: Pulmonary effort is normal.  ?   Breath sounds: Normal breath sounds.  ?Abdominal:  ?   General: Abdomen is flat. Bowel sounds are normal.  ?   Palpations: Abdomen is soft.  ?   Tenderness: There is no abdominal tenderness. There is no guarding.  ?Musculoskeletal:     ?   General: Normal range of motion.  ?     Arms: ? ?   Cervical back: Normal range of motion and neck supple.  ?Skin: ?   General: Skin is warm and dry.  ?   Capillary Refill: Capillary refill takes less than 2 seconds.  ?Neurological:  ?   General: No focal deficit present.  ?  Mental Status: She is alert and oriented to person, place, and time.  ?   Deep Tendon Reflexes: Reflexes normal.  ?Psychiatric:     ?   Mood and Affect: Mood normal.     ?   Behavior: Behavior normal.  ? ? ?ED Results / Procedures / Treatments   ?Labs ?(all labs ordered are listed, but only abnormal results are displayed) ?Labs Reviewed - No data to display ? ?EKG ?None ? ?Radiology ?No results found. ? ?Procedures ?Procedures  ? ? ?Medications Ordered in ED ?Medications  ?predniSONE (DELTASONE) tablet 60 mg (has no administration in time range)  ? ? ?ED Course/ Medical Decision Making/ A&P ?  ?                        ?Medical Decision Making ?Insect envenomation x 3, house is being bombed as patient and other family are getting bitten ? ?Amount and/or Complexity of Data Reviewed ?External Data Reviewed: notes. ?   Details: previous notes reviewed ? ?Risk ?Prescription drug  management. ?Risk Details: Patient is being started on oral steroids for itching and I recommend benadryl.  Patient drove here but has some at home and will take it when she gets home.  RX for steroids.   ? ? ? ?Final Clinical Impression(s) / ED Diagnoses ?Final diagnoses:  ?None  ? ?Return for intractable cough, coughing up blood, fevers > 100.4 unrelieved by medication, shortness of breath, intractable vomiting, chest pain, shortness of breath, weakness, numbness, changes in speech, facial asymmetry, abdominal pain, passing out, Inability to tolerate liquids or food, cough, altered mental status or any concerns. No signs of systemic illness or infection. The patient is nontoxic-appearing on exam and vital signs are within normal limits.  ?I have reviewed the triage vital signs and the nursing notes. Pertinent labs & imaging results that were available during my care of the patient were reviewed by me and considered in my medical decision making (see chart for details). After history, exam, and medical workup I feel the patient has been appropriately medically screened and is safe for discharge home. Pertinent diagnoses were discussed with the patient. Patient was given return precautions.  ?  ?Rx / DC Orders ?ED Discharge Orders   ? ? None  ? ?  ? ? ?  ?Hadlie Gipson, MD ?03/24/21 0308 ? ?

## 2021-03-24 NOTE — ED Notes (Signed)
Patient verbalizes understanding of discharge instructions. Opportunity for questioning and answers were provided. Armband removed by staff, pt discharged from ED. Ambulated out to lobby  

## 2021-03-24 NOTE — ED Triage Notes (Addendum)
Pt reports being bitten on left forehead by "something" around 10 am yesterday. Now has swelling in area and in right eye. Itchy, but not painful. Denies vision changes in affected eye.  ?

## 2021-05-04 DIAGNOSIS — M5441 Lumbago with sciatica, right side: Secondary | ICD-10-CM | POA: Diagnosis not present

## 2021-05-04 DIAGNOSIS — M5459 Other low back pain: Secondary | ICD-10-CM | POA: Diagnosis not present

## 2021-05-04 DIAGNOSIS — M5442 Lumbago with sciatica, left side: Secondary | ICD-10-CM | POA: Diagnosis not present

## 2021-05-10 DIAGNOSIS — M5441 Lumbago with sciatica, right side: Secondary | ICD-10-CM | POA: Diagnosis not present

## 2021-05-10 DIAGNOSIS — M5442 Lumbago with sciatica, left side: Secondary | ICD-10-CM | POA: Diagnosis not present

## 2021-05-10 DIAGNOSIS — M5459 Other low back pain: Secondary | ICD-10-CM | POA: Diagnosis not present

## 2021-05-15 DIAGNOSIS — M5459 Other low back pain: Secondary | ICD-10-CM | POA: Diagnosis not present

## 2021-05-15 DIAGNOSIS — M5442 Lumbago with sciatica, left side: Secondary | ICD-10-CM | POA: Diagnosis not present

## 2021-05-15 DIAGNOSIS — M5441 Lumbago with sciatica, right side: Secondary | ICD-10-CM | POA: Diagnosis not present

## 2022-07-14 ENCOUNTER — Other Ambulatory Visit: Payer: Self-pay

## 2022-07-14 ENCOUNTER — Encounter: Payer: Self-pay | Admitting: Emergency Medicine

## 2022-07-14 ENCOUNTER — Emergency Department: Payer: Self-pay

## 2022-07-14 ENCOUNTER — Telehealth: Payer: Self-pay | Admitting: Urgent Care

## 2022-07-14 ENCOUNTER — Emergency Department
Admission: EM | Admit: 2022-07-14 | Discharge: 2022-07-14 | Disposition: A | Discharge status: Home / Self Care | Payer: Self-pay | Attending: Emergency Medicine | Admitting: Emergency Medicine

## 2022-07-14 ENCOUNTER — Ambulatory Visit: Payer: Self-pay

## 2022-07-14 DIAGNOSIS — Z3A12 12 weeks gestation of pregnancy: Secondary | ICD-10-CM | POA: Insufficient documentation

## 2022-07-14 DIAGNOSIS — O209 Hemorrhage in early pregnancy, unspecified: Secondary | ICD-10-CM | POA: Insufficient documentation

## 2022-07-14 DIAGNOSIS — O3680X0 Pregnancy with inconclusive fetal viability, not applicable or unspecified: Secondary | ICD-10-CM

## 2022-07-14 LAB — COMPREHENSIVE METABOLIC PANEL
ALT: 13 U/L (ref 0–44)
AST: 16 U/L (ref 15–41)
Albumin: 3.5 g/dL (ref 3.5–5.0)
Alkaline Phosphatase: 58 U/L (ref 38–126)
Anion gap: 6 (ref 5–15)
BUN: 13 mg/dL (ref 6–20)
CO2: 24 mmol/L (ref 22–32)
Calcium: 8.6 mg/dL — ABNORMAL LOW (ref 8.9–10.3)
Chloride: 106 mmol/L (ref 98–111)
Creatinine, Ser: 0.7 mg/dL (ref 0.44–1.00)
GFR, Estimated: >60 mL/min (ref >60)
Glucose, Bld: 95 mg/dL (ref 70–99)
Potassium: 4 mmol/L (ref 3.5–5.1)
Sodium: 136 mmol/L (ref 135–145)
Total Bilirubin: 0.7 mg/dL (ref 0.3–1.2)
Total Protein: 7.1 g/dL (ref 6.5–8.1)

## 2022-07-14 LAB — CBC WITH DIFFERENTIAL/PLATELET
Abs Immature Granulocytes: 0.02 10*3/uL (ref 0.00–0.07)
Basophils Absolute: 0 10*3/uL (ref 0.0–0.1)
Basophils Relative: 0 %
Eosinophils Absolute: 0.1 10*3/uL (ref 0.0–0.5)
Eosinophils Relative: 1 %
HCT: 32.7 % — ABNORMAL LOW (ref 36.0–46.0)
Hemoglobin: 10.6 g/dL — ABNORMAL LOW (ref 12.0–15.0)
Immature Granulocytes: 0 %
Lymphocytes Relative: 42 %
Lymphs Abs: 2.2 10*3/uL (ref 0.7–4.0)
MCH: 29.4 pg (ref 26.0–34.0)
MCHC: 32.4 g/dL (ref 30.0–36.0)
MCV: 90.8 fL (ref 80.0–100.0)
Monocytes Absolute: 0.3 10*3/uL (ref 0.1–1.0)
Monocytes Relative: 7 %
Neutro Abs: 2.6 10*3/uL (ref 1.7–7.7)
Neutrophils Relative %: 50 %
Platelets: 288 10*3/uL (ref 150–400)
RBC: 3.6 MIL/uL — ABNORMAL LOW (ref 3.87–5.11)
RDW: 13.2 % (ref 11.5–15.5)
WBC: 5.2 10*3/uL (ref 4.0–10.5)
nRBC: 0 % (ref 0.0–0.2)

## 2022-07-14 LAB — HCG, QUANTITATIVE, PREGNANCY: hCG, Beta Chain, Quant, S: 410 m[IU]/mL — ABNORMAL HIGH (ref <5)

## 2022-07-14 LAB — TYPE AND SCREEN
ABO/RH(D): O POS
Antibody Screen: NEGATIVE

## 2022-07-14 LAB — POC URINE PREG, ED: Preg Test, Ur: POSITIVE — AB

## 2022-07-14 NOTE — Telephone Encounter (Signed)
Attempted to contact patient regarding her appointment with UC. Left VM asking her to call back. Was going to inform the patient that we have no means of evaluating her for a possible miscarriage - that she should either contact her OB-GYN or go the ED for possible ultrasound and pelvic exam.

## 2022-07-14 NOTE — Discharge Instructions (Signed)
Your pregnancy hormone was low at 410.  On your ultrasound were not seeing an obvious ectopic pregnancy or a pregnancy in the uterus.  This could mean that you are very early on in pregnancy or that you are having a miscarriage or you could go on to have an ectopic pregnancy. I recommend that you have your pregnancy hormone repeated in about 48 hours to see if is trending up or down.  If you are experiencing heavy bleeding and are soaking through a pad more frequently than once every 2 hours for 4 hours and please return to the emergency department.  Please follow-up with OB/GYN.

## 2022-07-14 NOTE — ED Provider Notes (Signed)
[facility] Provider Note    Event Date/Time   First MD Initiated Contact with Patient 07/14/22 1311     (approximate)   History   Vaginal Bleeding   HPI  Patricia Hunter is a 47 y.o. female G7, P2 who presents because of vaginal bleeding.  Patient tells me that her last regular menstrual period was about 4 months ago.  She typically will get a regular monthly menstrual period.  She has had some intermittent spotting over the last 12 weeks but not a typical period for her.  She does have breast tenderness and thought she could be pregnant.  Started having more heavy bleeding, about the amount of her normal.  On Saturday and 2 today.  She did note that last Wednesday she passed what looked like tissue.  She is concerned about miscarriage.  She is having some lower abdominal cramping does not localize to either side.  Patient has been pregnant a total of 7 times including this time.  She has 2 children had a miscarriage and abortion will molar pregnancy and a vanishing twin.     History reviewed. No pertinent past medical history.  Patient Active Problem List   Diagnosis Date Noted   Women's annual routine gynecological examination 01/18/2021   Screen for STD (sexually transmitted disease) 01/18/2021     Physical Exam  Triage Vital Signs: ED Triage Vitals  Enc Vitals Group     BP 07/14/22 1303 119/81     Pulse Rate 07/14/22 1303 79     Resp 07/14/22 1303 19     Temp 07/14/22 1300 98.4 F (36.9 C)     Temp Source 07/14/22 1300 Oral     SpO2 07/14/22 1303 98 %     Weight 07/14/22 1257 160 lb (72.6 kg)     Height 07/14/22 1257 5\' 8"  (1.727 m)     Head Circumference --      Peak Flow --      Pain Score 07/14/22 1257 4     Pain Loc --      Pain Edu? --      Excl. in GC? --     Most recent vital signs: Vitals:   07/14/22 1300 07/14/22 1303  BP:  119/81  Pulse:  79  Resp:  19  Temp: 98.4 F (36.9 C)   SpO2:  98%      General: Awake, no distress.  CV:  Good peripheral perfusion.  Resp:  Normal effort.  Abd:  No distention.  Abdomen is soft and nontender throughout Neuro:             Awake, Alert, Oriented x 3  Other:     ED Results / Procedures / Treatments  Labs (all labs ordered are listed, but only abnormal results are displayed) Labs Reviewed  CBC WITH DIFFERENTIAL/PLATELET - Abnormal; Notable for the following components:      Result Value   RBC 3.60 (*)    Hemoglobin 10.6 (*)    HCT 32.7 (*)    All other components within normal limits  COMPREHENSIVE METABOLIC PANEL - Abnormal; Notable for the following components:   Calcium 8.6 (*)    All other components within normal limits  HCG, QUANTITATIVE, PREGNANCY - Abnormal; Notable for the following components:   hCG, Beta Chain, Quant, S 410 (*)    All other components within normal limits  POC URINE PREG, ED - Abnormal; Notable for the following components:   Preg Test,  Ur Positive (*)    All other components within normal limits  TYPE AND SCREEN     EKG     RADIOLOGY I reviewed and interpreted patient's postarrest ultrasound which is negative for IUP or ectopic   PROCEDURES:  Critical Care performed: No  Procedures    MEDICATIONS ORDERED IN ED: Medications - No data to display   IMPRESSION / MDM / ASSESSMENT AND PLAN / ED COURSE  I reviewed the triage vital signs and the nursing notes.                              Patient's presentation is most consistent with acute complicated illness / injury requiring diagnostic workup.  Differential diagnosis includes, but is not limited to, complete or complete miscarriage, threatened miscarriage, ectopic pregnancy, subchorionic hemorrhage  Patient is a 47 year old female presents with pregnant vaginal bleeding.  Her last normal monthly menstrual period was about 4 months ago she has had some intermittent spotting over the last 12 weeks but started having more heavy  bleeding about the amount of blood.  On Saturday, yesterday.  She also passed what she describes as tissue on Wednesday.  She is having some cramping as well.  And is hemodynamically stable she looks well.  Abdominal exam is benign.  Hemoglobin is at her baseline at 10.5.  She has pregnant with positive urine pregnancy test.  hCG is pending.  Will an ultrasound to evaluate for IUP.  Patient's hemoglobin is stable at 10.6.  hCG is low at 410.  Patient is O+.  On ultrasound we are not seeing an obvious ectopic or intrauterine pregnancy.  Counseled patient on nature of pregnancy of undetermined location.  Recommended serial hCG with repeat in 48 hours.  We discussed return precautions for heavy bleeding.  Ultimately suspect that patient is having miscarriage but I did relay to her that she could still go on to have an ectopic pregnancy or being very early on in a normal pregnancy.     FINAL CLINICAL IMPRESSION(S) / ED DIAGNOSES   Final diagnoses:  Pregnancy of unknown anatomic location     Rx / DC Orders   ED Discharge Orders     None        Note:  This document was prepared using Dragon voice recognition software and may include unintentional dictation errors.   Georga Hacking, MD 07/14/22 [phone]

## 2022-07-14 NOTE — Telephone Encounter (Signed)
Called patient again and spoke to patient. Informed her that this UC would not have the facilities to assess her for possible miscarriage. Told her that she needed to be seen by an OB-GYN provider or in ED setting. She acknowledged understanding.

## 2022-07-14 NOTE — ED Triage Notes (Signed)
Pt co of vaginal bleeding since 6/19 as well a slower bilateral abd pain. Pt states she was [redacted] weeks pregnant. Pt describes the bleeding as heavy/clots, having to use towels to help confine the bleeding. Pt denies n/v/diarrhea. Pt is A&Ox4 at this time. Pt walked with normal gait into triage.

## 2022-07-14 NOTE — ED Notes (Signed)
Patient taken to ultrasound.

## 2022-07-22 ENCOUNTER — Ambulatory Visit (INDEPENDENT_AMBULATORY_CARE_PROVIDER_SITE_OTHER): Payer: Self-pay

## 2022-07-22 VITALS — BP 100/60 | Ht 68.0 in | Wt 160.0 lb

## 2022-07-22 DIAGNOSIS — O039 Complete or unspecified spontaneous abortion without complication: Secondary | ICD-10-CM

## 2022-07-22 NOTE — Progress Notes (Signed)
   GYN ENCOUNTER  Encounter for follow up after SAB  Subjective  HPI: Patricia Hunter is a 47 y.o. [id number] who presents today for follow up after being seen in the emergency room for a miscarriage. She reports having regular periods but noted some tissue passing and heavier bleeding than normal starting on June 19. She present to the ED on June 22 where a pregnancy loss was confirmed via ultrasound where no gestational sac or products of conception were noted and bHcg of 410. She continued to bleed until 6/27. She is here today to confirm completion of SAB.   History reviewed. No pertinent past medical history. Past Surgical History:  Procedure Laterality Date   NO PAST SURGERIES     OB History     Gravida  4   Para  2   Term  2   Preterm  0   AB  2   Living  2      SAB  2   IAB  0   Ectopic  0   Multiple  0   Live Births  2        Obstetric Comments  2 vaginal deliveries        No Known Allergies  Review of Systems  12 point ROS negative except for pertinent positives noted in HPO above.   Objective  BP 100/60   Ht 5\' 8"  (1.727 m)   Wt 160 lb (72.6 kg)   BMI 24.33 kg/m   Physical examination GENERAL APPEARANCE: alert, well appearing, in no apparent distress, oriented to person, place and time NEUROLOGIC: alert, oriented, normal speech, no focal findings or movement disorder noted   Assessment/Plan - Reviewed that the course of her bleeding is consistent with complete SAB.  - Will collect bHcg today to confirm levels continue to drop. - If bHcg levels are the same or have increased, plan for follow up ultrasound.  - Reviewed current contraception as she was very surprised by this pregnancy. She is using VCF contraceptive gel which has worked well for the last 13 years. We discussed how no form of contraception is 100% effective except for abstinence and we also reviewed ovulation tracking. I emphasized that sperm can live for 5-7 days,  so intercourse prior to ovulation can result in pregnancy. She expressed understanding of the discussion and recommendations.   Patricia Hunter, CNM  07/22/22 3:34 PM

## 2022-07-23 LAB — BETA HCG QUANT (REF LAB): hCG Quant: 10 m[IU]/mL

## 2022-07-29 ENCOUNTER — Telehealth: Payer: Self-pay

## 2022-07-29 NOTE — Telephone Encounter (Signed)
Patient aware of results and no next steps.

## 2022-07-29 NOTE — Telephone Encounter (Signed)
Patient calling to follow-up on beta results and if anything else is needed. Please advise
